# Patient Record
Sex: Male | Born: 1975 | Race: White | Hispanic: No | Marital: Married | Smoking: Former smoker
Health system: Southern US, Community
[De-identification: ages and names within clinical notes are randomized; demographics above are authoritative.]

## PROBLEM LIST (undated history)

## (undated) DIAGNOSIS — L709 Acne, unspecified: Secondary | ICD-10-CM

## (undated) HISTORY — DX: Acne, unspecified: L70.9

---

## 2005-11-25 ENCOUNTER — Emergency Department (HOSPITAL_COMMUNITY): Admission: EM | Admit: 2005-11-25 | Discharge: 2005-11-25 | Payer: Self-pay | Admitting: Family Medicine

## 2007-07-21 ENCOUNTER — Ambulatory Visit: Payer: Self-pay | Admitting: Family Medicine

## 2007-08-01 ENCOUNTER — Ambulatory Visit: Payer: Self-pay | Admitting: Family Medicine

## 2010-05-26 ENCOUNTER — Ambulatory Visit
Admission: RE | Admit: 2010-05-26 | Discharge: 2010-05-26 | Payer: Self-pay | Source: Home / Self Care | Attending: Family Medicine | Admitting: Family Medicine

## 2010-07-31 ENCOUNTER — Encounter (INDEPENDENT_AMBULATORY_CARE_PROVIDER_SITE_OTHER): Payer: BC Managed Care – PPO | Admitting: Family Medicine

## 2010-07-31 DIAGNOSIS — K602 Anal fissure, unspecified: Secondary | ICD-10-CM

## 2010-07-31 DIAGNOSIS — F17200 Nicotine dependence, unspecified, uncomplicated: Secondary | ICD-10-CM

## 2010-07-31 DIAGNOSIS — Z0000 Encounter for general adult medical examination without abnormal findings: Secondary | ICD-10-CM

## 2010-12-08 ENCOUNTER — Encounter: Payer: Self-pay | Admitting: Family Medicine

## 2010-12-09 ENCOUNTER — Other Ambulatory Visit (INDEPENDENT_AMBULATORY_CARE_PROVIDER_SITE_OTHER): Payer: Managed Care, Other (non HMO)

## 2010-12-09 DIAGNOSIS — Z23 Encounter for immunization: Secondary | ICD-10-CM

## 2010-12-09 DIAGNOSIS — Z0000 Encounter for general adult medical examination without abnormal findings: Secondary | ICD-10-CM

## 2011-07-09 ENCOUNTER — Encounter: Payer: Self-pay | Admitting: Family Medicine

## 2011-07-09 ENCOUNTER — Ambulatory Visit (INDEPENDENT_AMBULATORY_CARE_PROVIDER_SITE_OTHER): Payer: 59 | Admitting: Family Medicine

## 2011-07-09 VITALS — BP 130/90 | HR 87 | Temp 98.6°F | Ht 70.0 in | Wt 155.0 lb

## 2011-07-09 DIAGNOSIS — F17200 Nicotine dependence, unspecified, uncomplicated: Secondary | ICD-10-CM | POA: Insufficient documentation

## 2011-07-09 DIAGNOSIS — J209 Acute bronchitis, unspecified: Secondary | ICD-10-CM

## 2011-07-09 MED ORDER — CLARITHROMYCIN 500 MG PO TABS: 500.0000 mg | ORAL_TABLET | Freq: Two times a day (BID) | ORAL | Status: AC

## 2011-07-09 NOTE — Patient Instructions (Signed)
Wait till tomorrow and see how you feel . If you are getting worse go ahead and take the antibiotic

## 2011-07-09 NOTE — Progress Notes (Signed)
  Subjective:    Patient ID: Jacob Ball, male    DOB: November 12, 1975, 36 y.o.   MRN: [id number]  HPI Approximately 10 days ago he woke up with a slight sore throat and nasal congestion followed a couple days later by coughing it became productive. No fever, chills or earache. The cough has gotten worse although today he does take it he is slightly better. He smokes. He also admits to drinking 3 or 4 beers per night.   Review of Systems     Objective:   Physical Exam alert and in no distress. Tympanic membranes and canals are normal. Throat is clear. Tonsils are normal. Neck is supple without adenopathy or thyromegaly. Cardiac exam shows a regular sinus rhythm without murmurs or gallops. Lungs are clear to auscultation.        Assessment & Plan:   1. Bronchitis, acute   2. Current smoker    I will write for Biaxin to recommend he wait until tomorrow before taking this. If he gets worse he'll start the medication. Also encouraged him to quit smoking although he is not quite ready for that. Also discussed the fact he should cut back on his alcohol consumption.

## 2012-02-03 ENCOUNTER — Ambulatory Visit (INDEPENDENT_AMBULATORY_CARE_PROVIDER_SITE_OTHER): Payer: 59 | Admitting: Family Medicine

## 2012-02-03 VITALS — BP 128/80 | HR 97 | Temp 98.7°F | Wt 156.0 lb

## 2012-02-03 DIAGNOSIS — J029 Acute pharyngitis, unspecified: Secondary | ICD-10-CM

## 2012-02-03 LAB — POCT RAPID STREP A (OFFICE): Rapid Strep A Screen: NEGATIVE

## 2012-02-03 NOTE — Progress Notes (Signed)
  Subjective:    Patient ID: Jacob Ball, male    DOB: 12-Nov-1975, 36 y.o.   MRN: [id number]  HPI Last night he noted a slight tickling in his throat. This morning he woke up with worsening throat, nasal congestion and hoarse voice. Fever, chills, earache, chest congestion. He smokes less than half a pack per day.   Review of Systems     Objective:   Physical Exam alert and in no distress. Tympanic membranes and canals are normal. Throat is clear. Tonsils are normal. Neck is supple without adenopathy or thyromegaly. Cardiac exam shows a regular sinus rhythm without murmurs or gallops. Lungs are clear to auscultation. Strep screen is negative       Assessment & Plan:   1. Pharyngitis  Rapid Strep A   recommend supportive care. Call if further trouble.

## 2012-02-03 NOTE — Patient Instructions (Signed)
Just treat your symptoms. Gargle if you need to. Tylenol for the pain. Call if further trouble

## 2013-02-26 ENCOUNTER — Ambulatory Visit (INDEPENDENT_AMBULATORY_CARE_PROVIDER_SITE_OTHER): Payer: 59 | Admitting: Family Medicine

## 2013-02-26 ENCOUNTER — Encounter: Payer: Self-pay | Admitting: Family Medicine

## 2013-02-26 VITALS — BP 120/82 | HR 68 | Wt 162.0 lb

## 2013-02-26 DIAGNOSIS — K648 Other hemorrhoids: Secondary | ICD-10-CM

## 2013-02-26 DIAGNOSIS — K644 Residual hemorrhoidal skin tags: Secondary | ICD-10-CM

## 2013-02-26 DIAGNOSIS — Z23 Encounter for immunization: Secondary | ICD-10-CM

## 2013-02-26 MED ORDER — HYDROCORTISONE ACE-PRAMOXINE 1-1 % RE FOAM: 1.0000 | Freq: Two times a day (BID) | RECTAL | Status: DC

## 2013-02-26 NOTE — Progress Notes (Signed)
  Subjective:    Patient ID: Jacob Ball, male    DOB: November 13, 1975, 37 y.o.   MRN: [id number]  HPI 3 days ago he noted the onset of slight bleeding and rectal pain with swelling. The bleeding has quieted down. He has no previous history of bleeding hemorrhoids but does have a previous history of redundant tissue   Review of Systems     Objective:   Physical Exam Anal exam shows prolapsed internal as well as external hemorrhoids. The internal hemorrhoids were able to be reduced with some relief of his symptoms. No evidence of thrombosis is noted to       Assessment & Plan:  Need for prophylactic vaccination and inoculation against influenza - Plan: Flu Vaccine QUAD 36+ mos IM  Internal prolapsed hemorrhoids - Plan: hydrocortisone-pramoxine (PROCTOFOAM HC) rectal foam  Internal and external bleeding hemorrhoids - Plan: hydrocortisone-pramoxine (PROCTOFOAM HC) rectal foam  Use ProctoFoam twice per day and also use some on the outside. Irrigate the area with warm water for 10 or 15 minutes several times per day. Softer bowel movements with fluids bulk in your diet ,exercise keeps you regular and when he had the urge to go go. If he has no improvement, he will call me

## 2013-02-26 NOTE — Patient Instructions (Addendum)
Use ProctoFoam twice per day and also use some on the outside. Irrigate the area with warm water for 10 or 15 minutes several times per day. Softer bowel movements with fluids bulk in your diet ,exercise keeps you regular and when he had the urge to go go.

## 2013-06-29 ENCOUNTER — Encounter: Payer: Self-pay | Admitting: Medical

## 2013-06-29 ENCOUNTER — Ambulatory Visit (INDEPENDENT_AMBULATORY_CARE_PROVIDER_SITE_OTHER): Payer: 59 | Admitting: Medical

## 2013-06-29 VITALS — BP 140/92 | HR 78 | Temp 98.0°F | Resp 16 | Wt 158.0 lb

## 2013-06-29 DIAGNOSIS — J329 Chronic sinusitis, unspecified: Secondary | ICD-10-CM

## 2013-06-29 MED ORDER — CLARITHROMYCIN 500 MG PO TABS: 500.0000 mg | ORAL_TABLET | Freq: Two times a day (BID) | ORAL | Status: DC

## 2013-06-29 NOTE — Patient Instructions (Signed)

## 2013-06-29 NOTE — Progress Notes (Signed)
   Subjective:   Jacob Ball is a 38 y.o. male presenting on 06/29/2013 with Cough  Hasn't been feeling great the last few weeks, but the last 2 weeks with head pressure, sinus pressure, cough, chest congestion, fatigue.  Seems to get a little better, tjhen worse again.  Has felt feverish at times, some chills, some wheezing.  Has some ear pressure.   Worse symptoms in head.   Denies nausea, vomiting, no SOB, no teeth pain.  Used neti pot one day.  He is a smoker.  +sick contacts.  No other aggravating or relieving factors.  No other complaint.  Review of Systems  ROS as in subjective    Objective:    Filed Vitals:   06/29/13 1154  BP: 140/92  Pulse: 78  Temp: 98 F (36.7 C)  Resp: 16    General appearance: alert, no distress, WD/WN HEENT: normocephalic, sclerae anicteric, TMs flat with serous effusion mild bilat, nares patent, no discharge +erythema, pharynx normal Oral cavity: MMM, no lesions Neck: supple, no lymphadenopathy, no thyromegaly, no masses Heart: RRR, normal S1, S2, no murmurs Lungs: CTA bilaterally, no wheezes, rhonchi, or rales     Assessment: Encounter Diagnosis  Name Primary?  . Sinusitis Yes    Plan: 1. Sinusitis Rest, hydrate well, discussed supportive care, call/return if not improving. - clarithromycin (BIAXIN) 500 MG tablet; Take 1 tablet (500 mg total) by mouth 2 (two) times daily.  Dispense: 20 tablet; Refill: 0   Return if symptoms worsen or fail to improve.

## 2017-04-07 DIAGNOSIS — Z23 Encounter for immunization: Secondary | ICD-10-CM | POA: Diagnosis not present

## 2017-10-05 ENCOUNTER — Ambulatory Visit: Payer: 59 | Admitting: Family Medicine

## 2017-10-05 ENCOUNTER — Encounter: Payer: Self-pay | Admitting: Family Medicine

## 2017-10-05 VITALS — BP 130/90 | HR 62 | Temp 98.6°F | Resp 16 | Ht 70.0 in | Wt 168.4 lb

## 2017-10-05 DIAGNOSIS — L089 Local infection of the skin and subcutaneous tissue, unspecified: Secondary | ICD-10-CM | POA: Diagnosis not present

## 2017-10-05 DIAGNOSIS — W57XXXA Bitten or stung by nonvenomous insect and other nonvenomous arthropods, initial encounter: Secondary | ICD-10-CM

## 2017-10-05 DIAGNOSIS — S70361A Insect bite (nonvenomous), right thigh, initial encounter: Secondary | ICD-10-CM

## 2017-10-05 DIAGNOSIS — L03115 Cellulitis of right lower limb: Secondary | ICD-10-CM

## 2017-10-05 MED ORDER — DOXYCYCLINE HYCLATE 100 MG PO TABS: 100.0000 mg | ORAL_TABLET | Freq: Two times a day (BID) | ORAL | 0 refills | Status: DC

## 2017-10-05 NOTE — Progress Notes (Signed)
   Subjective:    Patient ID: Jacob Ball, male    DOB: 08-14-1975, 42 y.o.   MRN: [id number]  HPI Chief Complaint  Patient presents with  . tick bite    tick bite- still flat, back of right leg. redness. there less than 12 hours. rash, no fever, headaches,   He is here with complaints of multiple tick bites on his lower extremities over the past 2 weeks but one that occurred on Monday on his right interior thigh seems to be infected. States the area of concern was bitten by a larger size tick 2 days ago.  Reports redness, itching and clear drainage since yesterday.  States the ticks were not on his body for longer than 12 hours and were not engorged.   Denies history of MRSA, diabetes or recurrent abscess.   Denies fever, chills, headache, neck pain, rash on soles or palms, chest pian, palpitations, shortness of breath, abdominal pain, N/V/D.   Reviewed allergies, medications, past medical, surgical, family, and social history.    Review of Systems Pertinent positives and negatives in the history of present illness.     Objective:   Physical Exam  Constitutional: He is oriented to person, place, and time. He appears well-developed and well-nourished. He does not have a sickly appearance. No distress.  Eyes: Pupils are equal, round, and reactive to light. Conjunctivae are normal.  Neck: Normal range of motion. Neck supple.  Neurological: He is alert and oriented to person, place, and time. He has normal strength. No cranial nerve deficit or sensory deficit. Gait normal.  Skin: Skin is warm and dry.     Right medial upper leg with a 1.5 cm area of induration surrounding a pinpoint insect bite with clear drainage. Surrounding pruritic area of erythema and warmth.  RLE is neurovascularly intact.    BP 130/90   Pulse 62   Temp 98.6 F (37 C) (Oral)   Resp 16   Ht  (1.778 m)   Wt 168 lb 6.4 oz (76.4 kg)   SpO2 98%   BMI 24.16 kg/m       Assessment &  Plan:  Cellulitis of right lower extremity - Plan: doxycycline (VIBRA-TABS) 100 MG tablet  Tick bite of right thigh with infection, initial encounter - Plan: doxycycline (VIBRA-TABS) 100 MG tablet  No systemic symptoms.  Discussed that the ticks that bit him were not embedded for longer than 12 hours and did not have a blood meal.  He does have acute cellulitis to his right thigh and will cover him with Doxycycline. He may also want to use Benadryl to control itching. Area of redness marked in the office and he will also take a picture of this. Follow up on Friday to ensure he is improving.

## 2017-10-07 ENCOUNTER — Ambulatory Visit: Payer: 59 | Admitting: Family Medicine

## 2017-10-07 ENCOUNTER — Encounter: Payer: Self-pay | Admitting: Family Medicine

## 2017-10-07 VITALS — BP 130/86 | HR 77 | Temp 98.2°F | Ht 70.0 in | Wt 168.2 lb

## 2017-10-07 DIAGNOSIS — Z09 Encounter for follow-up examination after completed treatment for conditions other than malignant neoplasm: Secondary | ICD-10-CM

## 2017-10-07 DIAGNOSIS — W57XXXD Bitten or stung by nonvenomous insect and other nonvenomous arthropods, subsequent encounter: Secondary | ICD-10-CM

## 2017-10-07 NOTE — Progress Notes (Signed)
   Subjective:    Patient ID: Jacob Ball, male    DOB: [birth year], 42 y.o.   MRN: [id number]  HPI Chief Complaint  Patient presents with  . other    follow up from tick bite   He is here to follow up on a tick bite and cellulitis. Started him on Doxycycline and he reports doing much better.   Denies fever, chills, headache, abdominal pain, N/V/D.   No rash on palms or soles.   Reviewed allergies, medications, past medical, surgical, family, and social history.    Review of Systems Pertinent positives and negatives in the history of present illness.     Objective:   Physical Exam BP 130/86 (BP Location: Left Arm, Patient Position: Sitting)   Pulse 77   Temp 98.2 F (36.8 C)   Ht  (1.778 m)   Wt 168 lb 3.2 oz (76.3 kg)   SpO2 97%   BMI 24.13 kg/m   Area of induration on his right medial thigh has diminished significantly. No longer has erythema surrounding. No drainage.      Assessment & Plan:  Follow up  Tick bite, subsequent encounter  He is doing much better. Continue Doxy and follow up if needed.

## 2017-10-21 ENCOUNTER — Ambulatory Visit: Payer: 59 | Admitting: Family Medicine

## 2017-10-21 ENCOUNTER — Encounter: Payer: Self-pay | Admitting: Family Medicine

## 2017-10-21 VITALS — BP 142/88 | HR 86 | Temp 98.3°F | Ht 69.0 in | Wt 168.2 lb

## 2017-10-21 DIAGNOSIS — Z0000 Encounter for general adult medical examination without abnormal findings: Secondary | ICD-10-CM

## 2017-10-21 DIAGNOSIS — F17200 Nicotine dependence, unspecified, uncomplicated: Secondary | ICD-10-CM

## 2017-10-21 LAB — POCT URINALYSIS DIP (PROADVANTAGE DEVICE)
BILIRUBIN UA: NEGATIVE mg/dL
Bilirubin, UA: NEGATIVE
Blood, UA: NEGATIVE
Glucose, UA: NEGATIVE mg/dL
Leukocytes, UA: NEGATIVE
Nitrite, UA: NEGATIVE
PH UA: 6.5 (ref 5.0–8.0)
PROTEIN UA: NEGATIVE mg/dL
SPECIFIC GRAVITY, URINE: 1.01
Urobilinogen, Ur: 3.5

## 2017-10-21 NOTE — Progress Notes (Signed)
   Subjective:    Patient ID: Jacob Ball, male    DOB: [birth year], 42 y.o.   MRN: [id number]  HPI He is here for complete examination.  He has no particular concerns or complaints.  He is now smoking only 3 cigarettes/day.  His work is going well.  His marriage is also going well.  He is on no medications.  He does not exercise regularly.  Family and social history as well as health maintenance and immunizations was reviewed.   Review of Systems     Objective:   Physical Exam BP (!) 142/88 (BP Location: Left Arm, Patient Position: Sitting)   Pulse 86   Temp 98.3 F (36.8 C)   Ht  (1.753 m)   Wt 168 lb 3.2 oz (76.3 kg)   SpO2 97%   BMI 24.84 kg/m   General Appearance:    Alert, cooperative, no distress, appears stated age  Head:    Normocephalic, without obvious abnormality, atraumatic  Eyes:    PERRL, conjunctiva/corneas clear, EOM's intact, fundi    benign  Ears:    Normal TM's and external ear canals  Nose:   Nares normal, mucosa normal, no drainage or sinus   tenderness  Throat:   Lips, mucosa, and tongue normal; teeth and gums normal  Neck:   Supple, no lymphadenopathy;  thyroid:  no   enlargement/tenderness/nodules; no carotid   bruit or JVD     Lungs:     Clear to auscultation bilaterally without wheezes, rales or     ronchi; respirations unlabored      Heart:    Regular rate and rhythm, S1 and S2 normal, no murmur, rub   or gallop     Abdomen:     Soft, non-tender, nondistended, normoactive bowel sounds,    no masses, no hepatosplenomegaly  Genitalia:    Normal male external genitalia without lesions.  Testicles without masses.  No inguinal hernias.  Rectal:    deferred.  Extremities:   No clubbing, cyanosis or edema  Pulses:   2+ and symmetric all extremities  Skin:   Skin color, texture, turgor normal, no rashes or lesions  Lymph nodes:   Cervical, supraclavicular, and axillary nodes normal  Neurologic:   CNII-XII intact, normal strength,  sensation and gait; reflexes 2+ and symmetric throughout          Psych:   Normal mood, affect, hygiene and grooming.          Assessment & Plan:  Smoker  Routine general medical examination at a health care facility - Plan: CBC with Differential/Platelet, Comprehensive metabolic panel, Lipid panel Discussed the fact that his blood pressure is slightly elevated and recommend exercise on a regular basis.  At this point do not feel the need to put him on any medication but will continue to monitor this.  He was comfortable with that.  Also discussed the fact that he can stop smoking Whenever he is ready that these are really more for psychological reasons.  He did understand

## 2017-10-21 NOTE — Patient Instructions (Signed)
20 minutes of something physical daily or 150 minutes a week . 

## 2017-10-22 LAB — COMPREHENSIVE METABOLIC PANEL
ALBUMIN: 4.7 g/dL (ref 3.5–5.5)
ALK PHOS: 55 IU/L (ref 39–117)
ALT: 21 IU/L (ref 0–44)
AST: 25 IU/L (ref 0–40)
Albumin/Globulin Ratio: 2 (ref 1.2–2.2)
BUN/Creatinine Ratio: 10 (ref 9–20)
BUN: 8 mg/dL (ref 6–24)
Bilirubin Total: 0.6 mg/dL (ref 0.0–1.2)
CO2: 22 mmol/L (ref 20–29)
CREATININE: 0.77 mg/dL (ref 0.76–1.27)
Calcium: 9.6 mg/dL (ref 8.7–10.2)
Chloride: 102 mmol/L (ref 96–106)
GFR calc Af Amer: 129 mL/min/{1.73_m2} (ref >59)
GFR calc non Af Amer: 112 mL/min/{1.73_m2} (ref >59)
Globulin, Total: 2.4 g/dL (ref 1.5–4.5)
Glucose: 94 mg/dL (ref 65–99)
Potassium: 4.4 mmol/L (ref 3.5–5.2)
Sodium: 138 mmol/L (ref 134–144)
Total Protein: 7.1 g/dL (ref 6.0–8.5)

## 2017-10-22 LAB — CBC WITH DIFFERENTIAL/PLATELET
Basophils Absolute: 0.1 10*3/uL (ref 0.0–0.2)
Basos: 1 %
EOS (ABSOLUTE): 0.2 10*3/uL (ref 0.0–0.4)
Eos: 3 %
HEMOGLOBIN: 15.9 g/dL (ref 13.0–17.7)
Hematocrit: 45.4 % (ref 37.5–51.0)
IMMATURE GRANULOCYTES: 0 %
Immature Grans (Abs): 0 10*3/uL (ref 0.0–0.1)
LYMPHS ABS: 1.2 10*3/uL (ref 0.7–3.1)
Lymphs: 24 %
MCH: 31.6 pg (ref 26.6–33.0)
MCHC: 35 g/dL (ref 31.5–35.7)
MCV: 90 fL (ref 79–97)
MONOCYTES: 10 %
Monocytes Absolute: 0.5 10*3/uL (ref 0.1–0.9)
NEUTROS PCT: 62 %
Neutrophils Absolute: 3.1 10*3/uL (ref 1.4–7.0)
Platelets: 247 10*3/uL (ref 150–450)
RBC: 5.03 x10E6/uL (ref 4.14–5.80)
RDW: 13.6 % (ref 12.3–15.4)
WBC: 5.1 10*3/uL (ref 3.4–10.8)

## 2017-10-22 LAB — LIPID PANEL
CHOLESTEROL TOTAL: 193 mg/dL (ref 100–199)
Chol/HDL Ratio: 2.6 ratio (ref 0.0–5.0)
HDL: 75 mg/dL (ref >39)
LDL CALC: 106 mg/dL — AB (ref 0–99)
TRIGLYCERIDES: 60 mg/dL (ref 0–149)
VLDL CHOLESTEROL CAL: 12 mg/dL (ref 5–40)

## 2017-11-07 ENCOUNTER — Other Ambulatory Visit: Payer: 59

## 2017-11-07 ENCOUNTER — Other Ambulatory Visit: Payer: Self-pay

## 2017-11-07 ENCOUNTER — Telehealth: Payer: Self-pay

## 2017-11-07 DIAGNOSIS — Z1211 Encounter for screening for malignant neoplasm of colon: Secondary | ICD-10-CM

## 2017-11-07 LAB — HEMOCCULT GUIAC POC 1CARD (OFFICE)
Card #3 Fecal Occult Blood, POC: NEGATIVE
FECAL OCCULT BLD: NEGATIVE
Fecal Occult Blood, POC: NEGATIVE

## 2017-11-07 NOTE — Telephone Encounter (Signed)
Called pt to fid out when he provided the stool samples. No answer lvm. KH

## 2017-11-10 ENCOUNTER — Encounter: Payer: Self-pay | Admitting: Family Medicine

## 2017-11-10 ENCOUNTER — Ambulatory Visit: Payer: 59 | Admitting: Family Medicine

## 2017-11-10 VITALS — BP 138/88 | HR 74 | Temp 98.1°F | Ht 69.0 in | Wt 169.8 lb

## 2017-11-10 DIAGNOSIS — R201 Hypoesthesia of skin: Secondary | ICD-10-CM | POA: Diagnosis not present

## 2017-11-10 NOTE — Progress Notes (Signed)
   Subjective:    Patient ID: Jacob Ball, male    DOB: 01/03/1976, 42 y.o.   MRN: [id number]  HPI He apparently woke up Monday and had a tingling sensation in his left arm down to the fourth and fifth fingers.  That sensation has essentially gone away.  He also states that he had a rash but again today it is not present.   Review of Systems     Objective:   Physical Exam Alert and in no distress.  Exam of the arm shows no obvious lesions.  Normal motor, sensory and DTRs.       Assessment & Plan:  Hypesthesia I explained that the area he is talking about could easily be the ulnar nerve and he possibly could have had a slight compression on that.  He was comfortable with that.  Discussed keeping his elbow bent at 90 degrees

## 2018-05-23 ENCOUNTER — Ambulatory Visit
Admission: RE | Admit: 2018-05-23 | Discharge: 2018-05-23 | Disposition: A | Discharge status: Home / Self Care | Payer: 59 | Source: Ambulatory Visit | Attending: Medical | Admitting: Medical

## 2018-05-23 ENCOUNTER — Ambulatory Visit: Payer: 59 | Admitting: Medical

## 2018-05-23 ENCOUNTER — Encounter: Payer: Self-pay | Admitting: Medical

## 2018-05-23 VITALS — BP 120/78 | HR 82 | Temp 98.3°F | Wt 175.4 lb

## 2018-05-23 DIAGNOSIS — R05 Cough: Secondary | ICD-10-CM

## 2018-05-23 DIAGNOSIS — F17200 Nicotine dependence, unspecified, uncomplicated: Secondary | ICD-10-CM

## 2018-05-23 DIAGNOSIS — J3489 Other specified disorders of nose and nasal sinuses: Secondary | ICD-10-CM | POA: Diagnosis not present

## 2018-05-23 DIAGNOSIS — R0989 Other specified symptoms and signs involving the circulatory and respiratory systems: Secondary | ICD-10-CM

## 2018-05-23 DIAGNOSIS — R059 Cough, unspecified: Secondary | ICD-10-CM

## 2018-05-23 MED ORDER — AZITHROMYCIN 250 MG PO TABS: ORAL_TABLET | ORAL | 0 refills | Status: DC

## 2018-05-23 NOTE — Progress Notes (Signed)
Subjective: Chief Complaint  Patient presents with  . other    cough and congestion. for about three weeks. more sinus congestion   Symptoms include 3 week hx/o cough, congestion, sinus pressure, will get some improvement but then worse again.   No fever, some sweats.   Cough has been non productive.  No NVD.   Sometimes a little SOB.  Has had some sore throat, has had some blood tinged phlegm 2 weeks ago but this improved.   Currently symptoms are mainly lingering.  Using nothing for symptoms.   Is using Vape.  No other aggravating or relieving factors.  No other complaint.    Past Medical History:  Diagnosis Date  . Acne     Current Outpatient Medications on File Prior to Visit  Medication Sig Dispense Refill  . doxycycline (VIBRA-TABS) 100 MG tablet Take 1 tablet (100 mg total) by mouth 2 (two) times daily. (Patient not taking: Reported on 11/10/2017) 20 tablet 0   No current facility-administered medications on file prior to visit.     ROS as in subjective   Objective: BP 120/78 (BP Location: Left Arm, Patient Position: Sitting)   Pulse 82   Temp 98.3 F (36.8 C)   Wt 175 lb 6.4 oz (79.6 kg)   SpO2 98%   BMI 25.90 kg/m   General appearance: Alert, well developed, well nourished, no distress                             Skin: warm, no rash                           Head: mild sinus tenderness,                            Eyes: conjunctiva pink, corneas clear                            Ears: flat left tympanic membrane, flat right tympanic membrane, external ear canals normal                          Nose: septum midline, turbinates swollen, with erythema and clear discharge             Mouth/throat: MMM, tongue normal,no pharyngeal erythema                           Neck: supple, no adenopathy, no thyromegaly, non tender                         Lungs: clear, no wheezes, no rales, no rhonchi         Assessment  Encounter Diagnoses  Name Primary?  . Cough Yes  . Sinus  pressure   . Phlegm in throat   . Smoker       Plan: Discussed diagnosis and treatment of respiratory tract infection.  Begin Zpak, go for chest xray, rest, hydrate well, can use Mucinex DM.       Discussed usual time frame to see improvement. Discussed possible complications or symptoms that would prompt call back or recheck within the next few days.      Patient was advised to call or return if worse or not  improving in the next few days.    Patient voiced understanding of diagnosis, recommendations, and treatment plan  Jacob Ball was seen today for other.  Diagnoses and all orders for this visit:  Cough -     DG Chest 2 View; Future  Sinus pressure  Phlegm in throat -     DG Chest 2 View; Future  Smoker -     DG Chest 2 View; Future  Other orders -     azithromycin (ZITHROMAX) 250 MG tablet; 2 tablets day 1, then 1 tablet days 2-4

## 2018-05-23 NOTE — Patient Instructions (Signed)
Recommendations:  Rest  hydrate well  Consider Mucinex DM  Begin Zpak antibiotic  Go for chest xray  If worse or not improving, call back

## 2019-12-24 IMAGING — DX DG CHEST 2V
2 series · 2 of 2 positions shown · non-contrast
Comparison: None.

CLINICAL DATA: Cough

EXAM:
CHEST - 2 VIEW

[dg chest 2 view (1 of 2)]
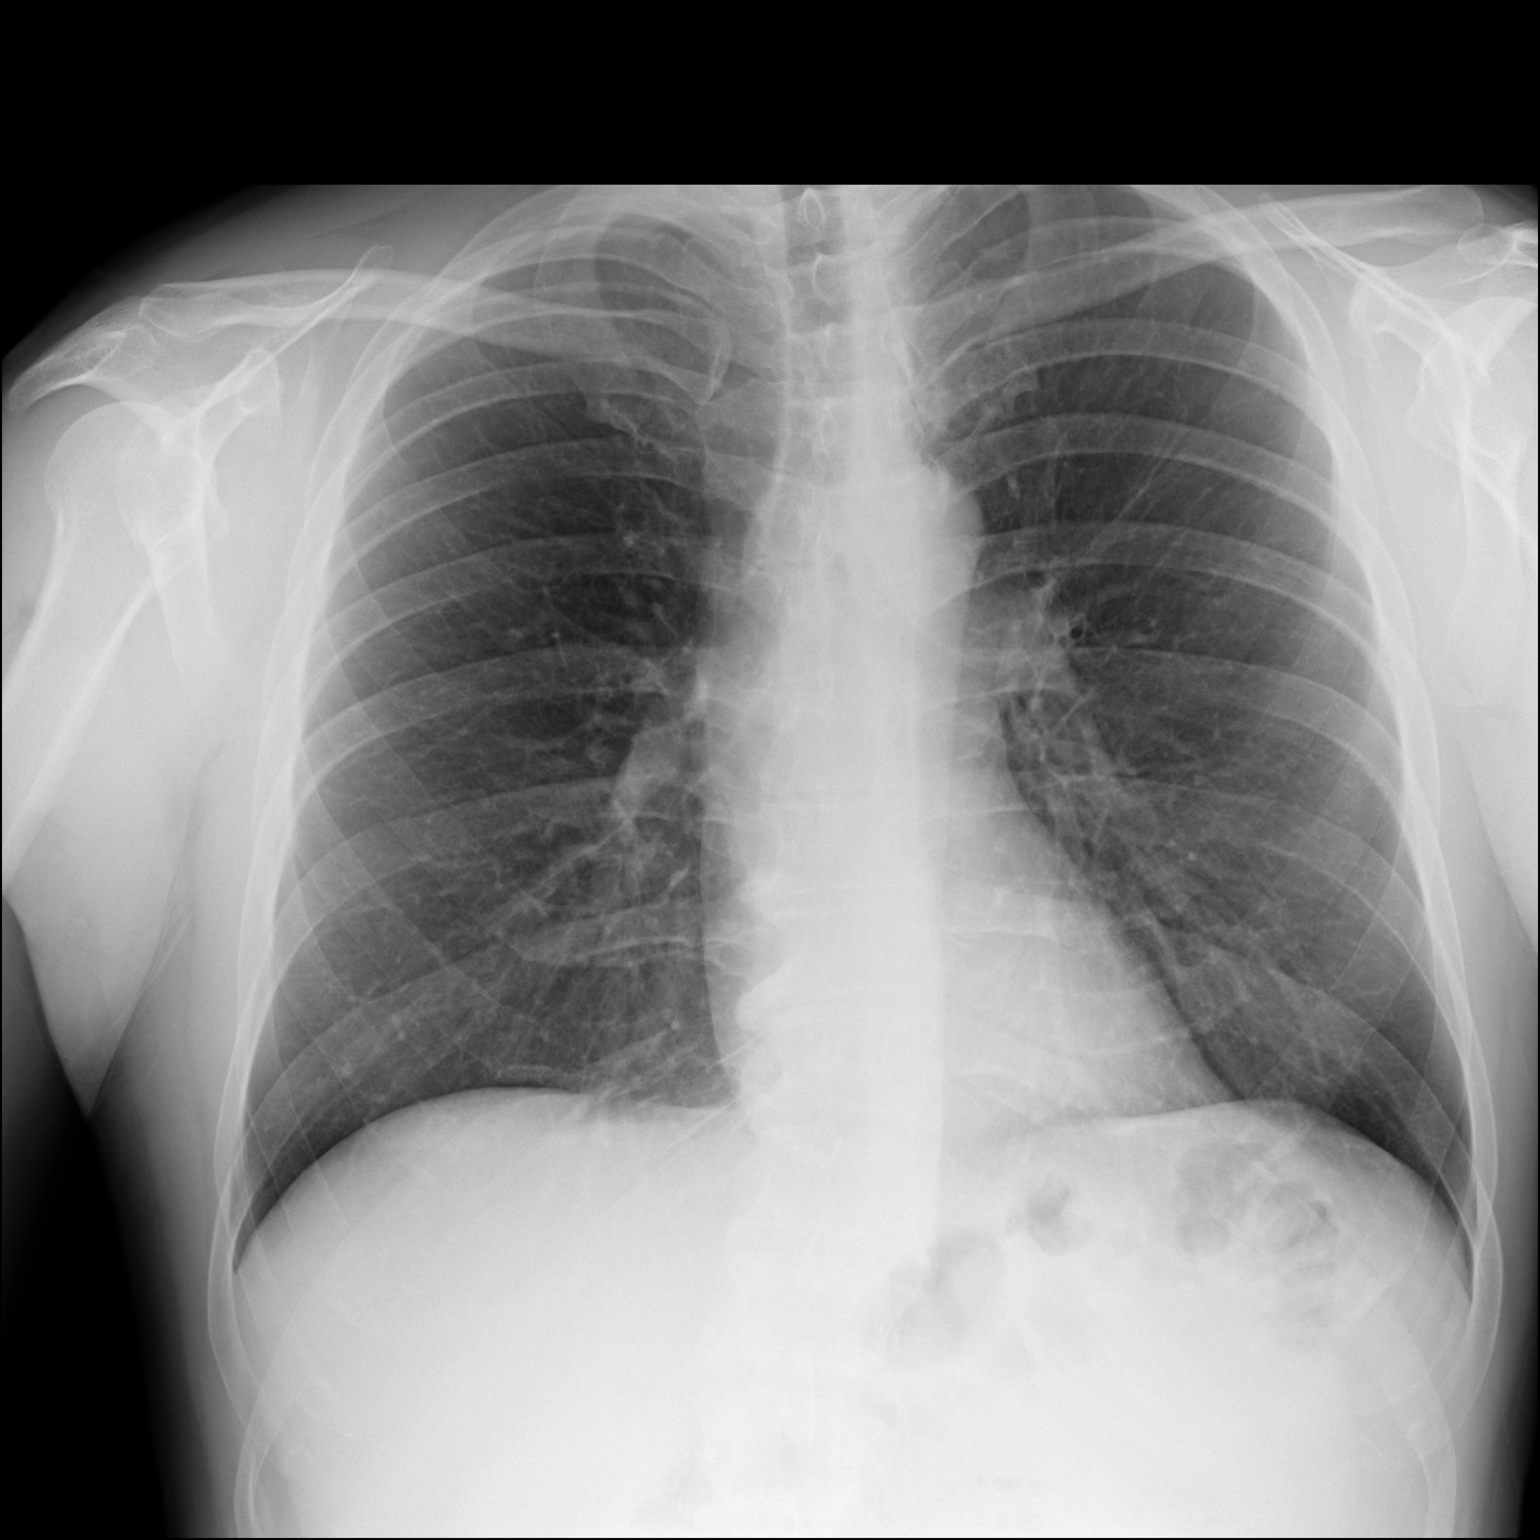

[dg chest 2 view (2 of 2)]
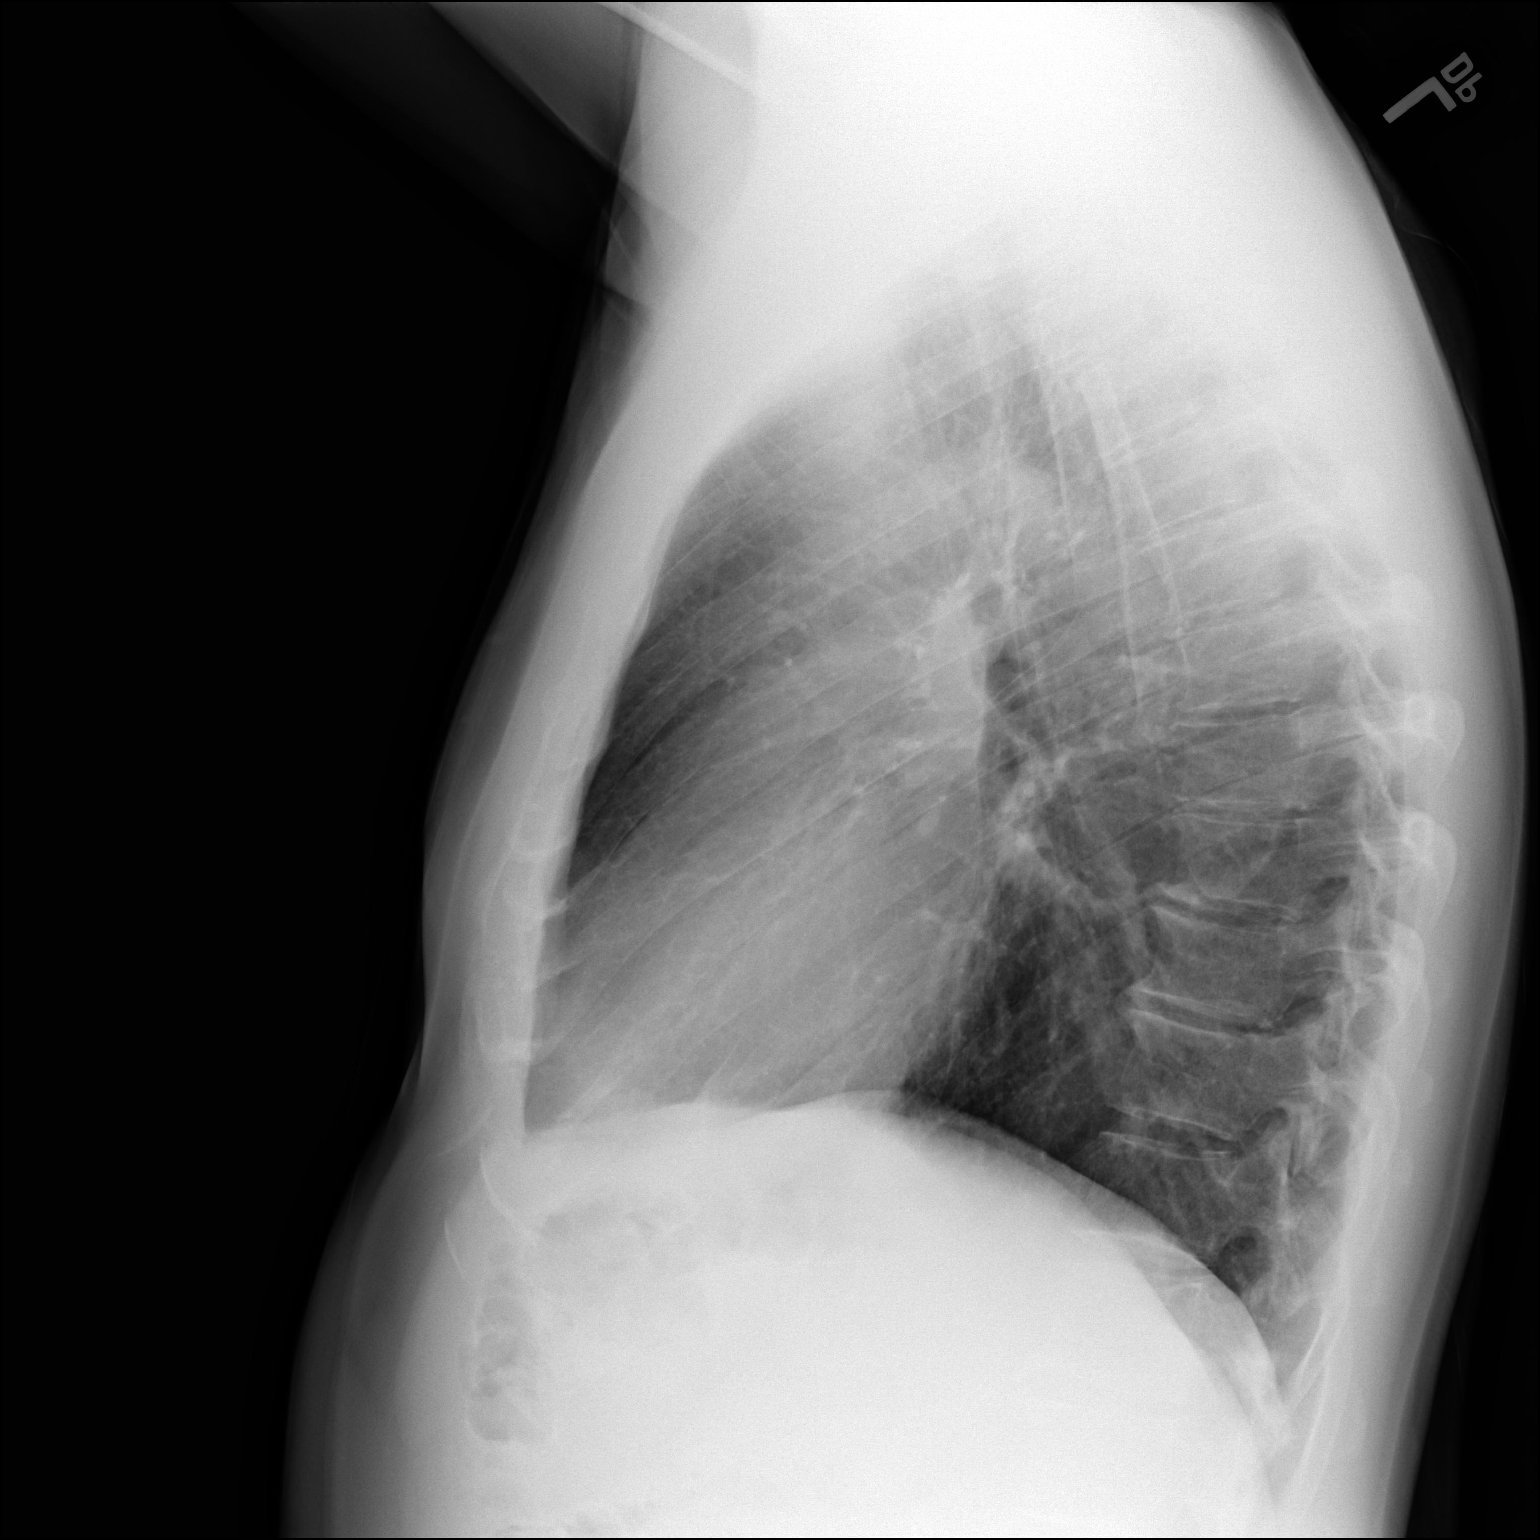

[2 of 2 positions shown; findings below may reference images not displayed]

FINDINGS: Lungs are clear. The heart size and pulmonary vascularity are
normal. No adenopathy. There is degenerative change in the thoracic
spine.
IMPRESSION: No edema or consolidation.

## 2022-01-27 ENCOUNTER — Encounter: Payer: Self-pay | Admitting: Internal Medicine

## 2022-03-02 ENCOUNTER — Encounter: Payer: Self-pay | Admitting: Internal Medicine

## 2022-03-15 ENCOUNTER — Encounter: Payer: Self-pay | Admitting: Internal Medicine

## 2022-11-03 ENCOUNTER — Telehealth: Payer: Managed Care, Other (non HMO) | Admitting: Physician Assistant

## 2022-11-03 DIAGNOSIS — M545 Low back pain, unspecified: Secondary | ICD-10-CM

## 2022-11-04 MED ORDER — CYCLOBENZAPRINE HCL 10 MG PO TABS: 10.0000 mg | ORAL_TABLET | Freq: Three times a day (TID) | ORAL | 0 refills | Status: DC | PRN

## 2022-11-04 MED ORDER — NAPROXEN 500 MG PO TABS: 500.0000 mg | ORAL_TABLET | Freq: Two times a day (BID) | ORAL | 0 refills | Status: DC

## 2022-11-04 NOTE — Progress Notes (Signed)

## 2022-11-04 NOTE — Progress Notes (Signed)
I have spent 5 minutes in review of e-visit questionnaire, review and updating patient chart, medical decision making and response to patient.   Baruc Tugwell Cody Abdimalik Mayorquin, PA-C    

## 2022-11-06 MED ORDER — PREDNISONE 20 MG PO TABS: 40.0000 mg | ORAL_TABLET | Freq: Every day | ORAL | 0 refills | Status: AC

## 2022-11-06 NOTE — Addendum Note (Signed)
Addended by: Christeen Douglas Z on: 11/06/2022 02:42 PM   Modules accepted: Orders

## 2022-11-08 ENCOUNTER — Ambulatory Visit (INDEPENDENT_AMBULATORY_CARE_PROVIDER_SITE_OTHER): Payer: Managed Care, Other (non HMO) | Admitting: Family Medicine

## 2022-11-08 ENCOUNTER — Encounter: Payer: Self-pay | Admitting: Family Medicine

## 2022-11-08 VITALS — BP 134/84 | HR 84 | Ht 69.0 in | Wt 183.2 lb

## 2022-11-08 DIAGNOSIS — M5416 Radiculopathy, lumbar region: Secondary | ICD-10-CM | POA: Diagnosis not present

## 2022-11-08 NOTE — Progress Notes (Signed)
Chief Complaint  Patient presents with   Back Pain    Right sided LBP that started last Thursday. Sat down wrong Thursday am and it has been a struggle ever since. Did a virtual visit Thursday morning before this happened.    This patient hasn't been seen in our office since 2019. He recalls that in the Spring of 2023 he jarred his back (missed the last step), got better pretty quickly (within a week or so). He hasn't had any problems since then until he "tweaked" his back a couple of weeks ago. Can't recall anything that triggered it, was painful in the morning.  He had e-visit on 6/13. Complaint at that time was low back/buttock pain on R.  Pain radiated down the R glute to the hamstring to the top of the calf.  Worse with walking, lifting legs. Stretching and lunges made it worse. Through e-visit, he was prescribed flexeril and naproxen. Thurs 6/13 he was able to get his prescriptions. Spasms got worse later that day.  He sent additional MyChart message on 6/15--pain worse, unable to sit at desk chair, walking very slow/tedious/painful. Rx for prednisone was sent in that day, 20mg , to take 40mg  x 5 d He took just one 20 mg tablet yesterday.  Currently--pain goes further down the calf than it did over the weekend. Sometimes the 3, 4, and 5th toes go numb. Denies weakness Pain is at the lower back/hip/glute and down the hamstrings, and the lateral calf of the RLE   PMH, PSH, SH reviewed and updated in chart  Took 2 naproxen on Fri and Sat, none yesterday Taking flexeril TID. Denies significant sedation.  Reports drinking 3-4 beers daily  Outpatient Encounter Medications as of 11/08/2022  Medication Sig Note   cyclobenzaprine (FLEXERIL) 10 MG tablet Take 1 tablet (10 mg total) by mouth 3 (three) times daily as needed for muscle spasms. 11/08/2022: Last dose this am 6am   predniSONE (DELTASONE) 20 MG tablet Take 2 tablets (40 mg total) by mouth daily with breakfast for 5 days. 11/08/2022:  Did not take today   naproxen (NAPROSYN) 500 MG tablet Take 1 tablet (500 mg total) by mouth 2 (two) times daily with a meal. (Patient not taking: Reported on 11/08/2022) 11/08/2022: Stopped taking naproxen Saturday   [DISCONTINUED] azithromycin (ZITHROMAX) 250 MG tablet 2 tablets day 1, then 1 tablet days 2-4 (Patient not taking: Reported on 11/08/2022)    [DISCONTINUED] doxycycline (VIBRA-TABS) 100 MG tablet Take 1 tablet (100 mg total) by mouth 2 (two) times daily. (Patient not taking: Reported on 11/10/2017)    No facility-administered encounter medications on file as of 11/08/2022.   Allergies  Allergen Reactions   Penicillins     ROS: No bowel or bladder problems. No f/c ,URI symptoms, CP, palpitations, SOB, no GI or GU complaints. No bleeding, bruising, rash   PHYSICAL EXAM:  BP 134/84   Pulse 84   Ht 5\' 9"  (1.753 m)   Wt 183 lb 3.2 oz (83.1 kg)   BMI 27.05 kg/m   Pleasant male, in severe discomfort with walking, sitting, and certain movements.  Seems relatively comfortable when standing still HEENT: conjunctiva and sclera are clear, EOMI. Back: Spine nontender, no CVA tenderness Firm/tender at R QL muscles Tender at  R sciatic notch, Slight tenderness at R SI. Tender at R gluteal/pyriformis muscles. Unable to cross R ankle to L knee due to pain. Normal on the left. He had acute pain at R buttock with lifting LEFT leg Some  radiation down lateral thigh/knee with R SLR Normal strength. Normal sensation tor light touch and temperature DTR's are 2+ and symmetric Extremities: no edema Psych: normal mood, affect, hygiene and grooming   ASSESSMENT/PLAN:  Lumbar back pain with radiculopathy affecting right lower extremity - take course of prednisone as rx'd. Will try and get him in for PT today.  Red flags reviewed, return if worsening  No red flags today, these were reviewed. Refer for PT and take prednisone course as prescribed. Cont muscle relaxants. Counseled re: alcohol  and meds. Encouraged to cut back. Long past due for CPE--may need to f/u on back in 1-2 weeks, but also schedule CPE with Dr. Susann Givens.  I spent 38 minutes dedicated to the care of this patient, including pre-visit review of records, face to face time, post-visit ordering of testing and documentation.  Take the prednisone as prescribed (40 mg (2 tablets) for 5 days). Take with food. Do not take the naproxen. You may continue the muscle relaxant. Avoid taking with alcohol--try and cut back on alcohol in general  Schedule a follow-up appointment and a physical with Dr. Susann Givens. We are referring you to PT.

## 2022-11-08 NOTE — Patient Instructions (Signed)
Take the prednisone as prescribed (40 mg (2 tablets) for 5 days). Take with food. Do not take the naproxen. You may continue the muscle relaxant. Avoid taking with alcohol--try and cut back on alcohol in general  Schedule a follow-up appointment and a physical with Dr. Susann Givens. We are referring you to PT.

## 2022-11-11 ENCOUNTER — Encounter: Payer: Self-pay | Admitting: Family Medicine

## 2022-11-11 ENCOUNTER — Telehealth: Payer: Self-pay | Admitting: Family Medicine

## 2022-11-11 NOTE — Telephone Encounter (Signed)
Yes, he may take the naproxen twice daily with food. I hope that he is getting some benefit from the PT.

## 2022-11-11 NOTE — Telephone Encounter (Signed)
Pt seen you Monday for back pain. He says he finished the prednisone and is still hurting. He took the last two yesterday morning around 8 am but he does have some Naproxen left and wondered if it would be ok to take that now that he is out of prednisone?

## 2022-12-13 ENCOUNTER — Encounter: Payer: Self-pay | Admitting: Family Medicine

## 2022-12-13 ENCOUNTER — Ambulatory Visit (INDEPENDENT_AMBULATORY_CARE_PROVIDER_SITE_OTHER): Payer: Managed Care, Other (non HMO) | Admitting: Family Medicine

## 2022-12-13 VITALS — BP 148/108 | HR 101 | Temp 98.3°F | Resp 16 | Ht 69.5 in | Wt 184.8 lb

## 2022-12-13 DIAGNOSIS — Z87891 Personal history of nicotine dependence: Secondary | ICD-10-CM | POA: Diagnosis not present

## 2022-12-13 DIAGNOSIS — R030 Elevated blood-pressure reading, without diagnosis of hypertension: Secondary | ICD-10-CM

## 2022-12-13 DIAGNOSIS — Z1211 Encounter for screening for malignant neoplasm of colon: Secondary | ICD-10-CM

## 2022-12-13 DIAGNOSIS — Z23 Encounter for immunization: Secondary | ICD-10-CM

## 2022-12-13 DIAGNOSIS — Z0000 Encounter for general adult medical examination without abnormal findings: Secondary | ICD-10-CM | POA: Diagnosis not present

## 2022-12-13 DIAGNOSIS — Z1159 Encounter for screening for other viral diseases: Secondary | ICD-10-CM

## 2022-12-13 DIAGNOSIS — F17200 Nicotine dependence, unspecified, uncomplicated: Secondary | ICD-10-CM

## 2022-12-13 NOTE — Patient Instructions (Addendum)
20 minutes of something physical daily or 150 minutes a week of something physical.  Cut down on your beer consumption to 1 or 2/day Also check out the DASH diet

## 2022-12-13 NOTE — Progress Notes (Signed)
Complete physical exam  Patient: Jacob Ball   DOB: 05-28-1975   47 y.o. Male  MRN: [id number]  Subjective:    Chief Complaint  Patient presents with   Annual Exam    Fasting. No additional concerns.     Jacob Ball is a 47 y.o. male who presents today for a complete physical exam.  He reports consuming a general diet. Home exercise routine includes walking and weight lifting. He generally feels fairly well. He reports sleeping fairly well.  He drinks 3 or 4 beers per night.  He did quit smoking.  I congratulated him on that.  Otherwise he has no concerns or complaints.  Family and social history as well as health maintenance and immunizations was reviewed  Most recent fall risk assessment:    12/13/2022    9:29 AM  Fall Risk   Falls in the past year? 0  Number falls in past yr: 0  Injury with Fall? 0  Risk for fall due to : No Fall Risks  Follow up Falls evaluation completed     Most recent depression screenings:    12/13/2022    9:29 AM  PHQ 2/9 Scores  PHQ - 2 Score 0    Vision:Not within last year  and Dental: Receives regular dental care    Immunization History  Administered Date(s) Administered   DTaP 06/21/1975, 09/30/1975, 12/02/1975, 11/25/1976, 07/05/1980   IPV 07/08/1975, 09/30/1975, 12/02/1975, 11/25/1976   Influenza Whole 04/27/2002   Influenza,inj,Quad PF,6+ Mos 02/26/2013   Influenza-Unspecified 04/06/2017, 02/21/2018   MMR 07/22/1976, 10/13/1993   Tdap 12/09/2010, 12/13/2022    Health Maintenance  Topic Date Due   HIV Screening  Never done   Hepatitis C Screening  Never done   Colonoscopy  Never done   COVID-19 Vaccine (1 - 2023-24 season) Never done   INFLUENZA VACCINE  12/23/2022   DTaP/Tdap/Td (8 - Td or Tdap) 12/12/2032   HPV VACCINES  Aged Out    Patient Care Team: Ronnald Nian, MD as PCP - General (Family Medicine)   Outpatient Medications Prior to Visit  Medication Sig   [DISCONTINUED] cyclobenzaprine  (FLEXERIL) 10 MG tablet Take 1 tablet (10 mg total) by mouth 3 (three) times daily as needed for muscle spasms.   [DISCONTINUED] naproxen (NAPROSYN) 500 MG tablet Take 1 tablet (500 mg total) by mouth 2 (two) times daily with a meal. (Patient not taking: Reported on 11/08/2022)   No facility-administered medications prior to visit.    Review of Systems  All other systems reviewed and are negative.   Family and social history as well as health maintenance and immunizations was reviewed.     Objective:    BP (!) 148/108 (BP Location: Right Arm, Cuff Size: Large)   Pulse (!) 101   Temp 98.3 F (36.8 C) (Oral)   Resp 16   Ht 5' 9.5" (1.765 m)   Wt 184 lb 12.8 oz (83.8 kg)   SpO2 97% Comment: room air  BMI 26.90 kg/m    Physical Exam  Alert and in no distress. Tympanic membranes and canals are normal. Pharyngeal area is normal. Neck is supple without adenopathy or thyromegaly. Cardiac exam shows a regular sinus rhythm without murmurs or gallops. Lungs are clear to auscultation.      Assessment & Plan:    Routine general medical examination at a health care facility - Plan: CBC with Differential/Platelet, Comprehensive metabolic panel, Lipid panel  Former smoker  Need for Tdap vaccination -  Plan: Tdap vaccine greater than or equal to 7yo IM  Need for hepatitis C screening test - Plan: Hepatitis C antibody  Screening for colon cancer - Plan: Cologuard  Elevated blood pressure reading  20 minutes of something physical daily or 150 minutes a week of something physical.  Cut down on your beer consumption to 1 or 2/day To return here in 1 month for blood pressure recheck.   Sharlot Gowda, MD

## 2022-12-14 LAB — CBC WITH DIFFERENTIAL/PLATELET
Basophils Absolute: 0.1 10*3/uL (ref 0.0–0.2)
Basos: 2 %
EOS (ABSOLUTE): 0.1 10*3/uL (ref 0.0–0.4)
Eos: 2 %
Hematocrit: 47.3 % (ref 37.5–51.0)
Hemoglobin: 16 g/dL (ref 13.0–17.7)
Immature Grans (Abs): 0 10*3/uL (ref 0.0–0.1)
Immature Granulocytes: 0 %
Lymphocytes Absolute: 1.1 10*3/uL (ref 0.7–3.1)
Lymphs: 24 %
MCH: 30.8 pg (ref 26.6–33.0)
MCHC: 33.8 g/dL (ref 31.5–35.7)
MCV: 91 fL (ref 79–97)
Monocytes Absolute: 0.4 10*3/uL (ref 0.1–0.9)
Monocytes: 10 %
Neutrophils Absolute: 2.8 10*3/uL (ref 1.4–7.0)
Neutrophils: 62 %
Platelets: 226 10*3/uL (ref 150–450)
RBC: 5.19 x10E6/uL (ref 4.14–5.80)
RDW: 12.1 % (ref 11.6–15.4)
WBC: 4.5 10*3/uL (ref 3.4–10.8)

## 2022-12-14 LAB — COMPREHENSIVE METABOLIC PANEL
ALT: 37 IU/L (ref 0–44)
AST: 24 IU/L (ref 0–40)
Albumin: 4.7 g/dL (ref 4.1–5.1)
Alkaline Phosphatase: 67 IU/L (ref 44–121)
BUN/Creatinine Ratio: 9 (ref 9–20)
BUN: 7 mg/dL (ref 6–24)
Bilirubin Total: 0.5 mg/dL (ref 0.0–1.2)
CO2: 22 mmol/L (ref 20–29)
Calcium: 9.6 mg/dL (ref 8.7–10.2)
Chloride: 101 mmol/L (ref 96–106)
Creatinine, Ser: 0.8 mg/dL (ref 0.76–1.27)
Globulin, Total: 2.2 g/dL (ref 1.5–4.5)
Glucose: 128 mg/dL — ABNORMAL HIGH (ref 70–99)
Potassium: 4.3 mmol/L (ref 3.5–5.2)
Sodium: 140 mmol/L (ref 134–144)
Total Protein: 6.9 g/dL (ref 6.0–8.5)
eGFR: 110 mL/min/{1.73_m2} (ref >59)

## 2022-12-14 LAB — LIPID PANEL
Chol/HDL Ratio: 3.6 ratio (ref 0.0–5.0)
Cholesterol, Total: 241 mg/dL — ABNORMAL HIGH (ref 100–199)
HDL: 67 mg/dL (ref >39)
LDL Chol Calc (NIH): 156 mg/dL — ABNORMAL HIGH (ref 0–99)
Triglycerides: 101 mg/dL (ref 0–149)
VLDL Cholesterol Cal: 18 mg/dL (ref 5–40)

## 2022-12-14 LAB — HEPATITIS C ANTIBODY: Hep C Virus Ab: NONREACTIVE

## 2023-01-13 ENCOUNTER — Encounter: Payer: Self-pay | Admitting: Family Medicine

## 2023-01-13 ENCOUNTER — Ambulatory Visit: Payer: Managed Care, Other (non HMO) | Admitting: Family Medicine

## 2023-01-13 VITALS — BP 148/112 | HR 98 | Temp 98.1°F | Resp 14 | Wt 188.4 lb

## 2023-01-13 DIAGNOSIS — I10 Essential (primary) hypertension: Secondary | ICD-10-CM | POA: Diagnosis not present

## 2023-01-13 MED ORDER — LOSARTAN POTASSIUM-HCTZ 50-12.5 MG PO TABS: 1.0000 | ORAL_TABLET | Freq: Every day | ORAL | 3 refills | Status: DC

## 2023-01-13 NOTE — Progress Notes (Signed)
   Subjective:    Patient ID: Jacob Ball, male    DOB: 1975-10-14, 47 y.o.   MRN: [id number]  HPI He is here for recheck on his blood pressure.   Review of Systems     Objective:    Physical Exam Alert and in no distress.  Blood pressure is recorded.  EKG read by me shows a rate of 77 with other parameters being normal.       Assessment & Plan:  Primary hypertension - Plan: losartan-hydrochlorothiazide (HYZAAR) 50-12.5 MG tablet, EKG 12-Lead Recommend that he buy blood pressure cuff and come back in 1 month for a recheck.  Explained the need to get readings at home rather than in the office.  He expressed understanding of this.  Also encouraged him to remain physically active.

## 2023-02-10 ENCOUNTER — Ambulatory Visit: Payer: Managed Care, Other (non HMO) | Admitting: Family Medicine

## 2023-03-10 ENCOUNTER — Encounter: Payer: Self-pay | Admitting: Family Medicine

## 2023-03-10 ENCOUNTER — Ambulatory Visit (INDEPENDENT_AMBULATORY_CARE_PROVIDER_SITE_OTHER): Payer: Managed Care, Other (non HMO) | Admitting: Family Medicine

## 2023-03-10 VITALS — BP 148/88 | HR 118 | Temp 98.2°F | Wt 187.2 lb

## 2023-03-10 DIAGNOSIS — I10 Essential (primary) hypertension: Secondary | ICD-10-CM | POA: Diagnosis not present

## 2023-03-10 NOTE — Progress Notes (Signed)
   Subjective:    Patient ID: Jacob Ball, male    DOB: 1976-04-07, 47 y.o.   MRN: [id number]  HPI He is here for recheck.  He did buy blood pressure cuff and did bring it in with this.  He has been on his BP meds for the last 3 weeks and definitely sees a change in it.   Review of Systems     Objective:    Physical Exam Alert and in no distress.  Blood pressure is recorded.       Assessment & Plan:   Primary hypertension Discussed proper blood pressure taking technique including resting sitting position arm at heart level.  His blood pressure cuff systolic seems to be fairly active but the diastolic goes higher than in the office.  He will make the appropriate adjustments to keep his pressure 130/80 or lower.  Recheck here in 6 months.

## 2023-05-31 LAB — HGB A1C W/O EAG: Hgb A1c MFr Bld: 5.3 % (ref 4.8–5.6)

## 2023-05-31 LAB — SPECIMEN STATUS REPORT

## 2023-07-19 ENCOUNTER — Encounter: Payer: Self-pay | Admitting: Internal Medicine

## 2023-09-13 ENCOUNTER — Ambulatory Visit: Payer: Managed Care, Other (non HMO) | Admitting: Family Medicine

## 2023-09-13 VITALS — BP 120/80 | HR 105 | Wt 188.0 lb

## 2023-09-13 DIAGNOSIS — I10 Essential (primary) hypertension: Secondary | ICD-10-CM | POA: Diagnosis not present

## 2023-09-13 NOTE — Progress Notes (Signed)
   Subjective:    Patient ID: Jacob Ball, male    DOB: December 17, 1975, 48 y.o.   MRN: [id number]  HPI He is here for a recheck on his blood pressure.  He initially said hospital was really an office visit.  He is taking Hyzaar and having no difficulty with that medication.  He is also been checking his blood pressure at home and apparently his machine is fairly accurate.   Review of Systems     Objective:    Physical Exam Alert and in no distress.  Blood pressure is recorded.       Assessment & Plan:  Primary hypertension Continue on present medication regimen.  Recommend he check his blood pressure no more than once per.  Discussed treatment of blood pressure in regard to preventing stroke, heart failure and kidney failure.

## 2023-09-27 ENCOUNTER — Encounter: Payer: Self-pay | Admitting: Family Medicine

## 2023-09-27 ENCOUNTER — Ambulatory Visit (INDEPENDENT_AMBULATORY_CARE_PROVIDER_SITE_OTHER): Admitting: Family Medicine

## 2023-09-27 VITALS — BP 122/70 | HR 105 | Wt 185.4 lb

## 2023-09-27 DIAGNOSIS — L570 Actinic keratosis: Secondary | ICD-10-CM | POA: Diagnosis not present

## 2023-09-27 MED ORDER — FLUOROURACIL 5 % EX CREA: TOPICAL_CREAM | Freq: Two times a day (BID) | CUTANEOUS | 0 refills | Status: DC

## 2023-09-27 NOTE — Progress Notes (Signed)
   Subjective:    Patient ID: Jacob Ball, male    DOB: [birth year], 48 y.o.   MRN: [id number]  HPI He is here for evaluation of several lesions present on the right zygomatic arch area.   Review of Systems     Objective:    Physical Exam He does have a wart-like appearing lesion of approximately 0.5 cm in size and another area near that that does show some slightly waxy appearance to it.  The third lesion is probably an actinic type lesion.       Assessment & Plan:  Actinic keratosis - Plan: fluorouracil (EFUDEX) 5 % cream Although long lesions could easily be a BCE, all of these should respond quite nicely to the Efudex.  Explained proper use of this.  He will call if continued difficulty.  He was comfortable with that.

## 2023-12-21 ENCOUNTER — Encounter: Payer: Managed Care, Other (non HMO) | Admitting: Family Medicine

## 2023-12-22 ENCOUNTER — Encounter: Payer: Self-pay | Admitting: Family Medicine

## 2023-12-22 ENCOUNTER — Ambulatory Visit (INDEPENDENT_AMBULATORY_CARE_PROVIDER_SITE_OTHER): Admitting: Family Medicine

## 2023-12-22 VITALS — BP 126/74 | HR 92 | Ht 69.0 in | Wt 188.6 lb

## 2023-12-22 DIAGNOSIS — Z0000 Encounter for general adult medical examination without abnormal findings: Secondary | ICD-10-CM

## 2023-12-22 DIAGNOSIS — Z87891 Personal history of nicotine dependence: Secondary | ICD-10-CM | POA: Diagnosis not present

## 2023-12-22 DIAGNOSIS — E7800 Pure hypercholesterolemia, unspecified: Secondary | ICD-10-CM | POA: Diagnosis not present

## 2023-12-22 DIAGNOSIS — I10 Essential (primary) hypertension: Secondary | ICD-10-CM | POA: Diagnosis not present

## 2023-12-22 LAB — LIPID PANEL

## 2023-12-22 NOTE — Progress Notes (Signed)
 Name: Jacob Ball   Date of Visit: 12/22/23   Date of last visit with me: Visit date not found   CHIEF COMPLAINT:  Chief Complaint  Patient presents with   Annual Exam    Cpe. Fasting.        HPI:  Discussed the use of AI scribe software for clinical note transcription with the patient, who gave verbal consent to proceed.  History of Present Illness   Jacob Ball is a 48 year old male who presents for a routine physical examination.  He has a history of hypertension and actinic keratosis. He monitors his blood pressure at home occasionally and is currently taking medication for hypertension.  He has been using 5-fluorouracil  (5FU) ointment for actinic keratosis on his face, applying it twice daily for about four weeks. The treatment has resulted in redness, but he believes the condition has improved. He had a couple of spots on his face, one of which was suspected to be cancerous and another a wart.  He has not been vaccinated for COVID-19 and has no interest in receiving the vaccine at this time. He recalls having an HIV screening before getting married, although it is not documented in his chart. 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 He is open to having his immunity to hepatitis B checked through labs. He has not received the pneumococcal vaccine.         OBJECTIVE:       12/22/2023    8:29 AM  Depression screen PHQ 2/9  Decreased Interest 0  Down, Depressed, Hopeless 0  PHQ - 2 Score 0     BP Readings from Last 3 Encounters:  12/22/23 126/74  09/27/23 122/70  09/13/23 120/80    BP 126/74   Pulse 92   Ht 5' 9 (1.753 m)   Wt 188 lb 9.6 oz (85.5 kg)   SpO2 98%   BMI 27.85 kg/m    Physical Exam          Physical Exam Constitutional:      Appearance: Normal appearance.  Skin:    General: Skin is warm.  Neurological:     General: No focal deficit present.      Mental Status: He is alert and oriented to person, place, and time. Mental status is at baseline.     ASSESSMENT/PLAN:   Assessment & Plan Hypercholesteremia  Primary hypertension  Annual physical exam  Former smoker    Assessment and Plan    Adult Wellness Visit Routine wellness visit with discussions on health maintenance, screenings, and vaccinations. Blood pressure controlled. Pneumococcal vaccine not needed. Discussed HIV screening and hepatitis B immunity. Consider testosterone testing based on symptoms and monitor PSA for prostate health. - Order lipid panel, BMP, CBC, PSA, HIV screening, hepatitis B antibody test. - Perform blood draw today. - Monitor blood pressure at home. - Defer pneumococcal vaccine. - Consent obtained for HIV screening. - Order hepatitis B antibody test. - Consider testosterone testing if symptoms suggestive of low testosterone arise. - Monitor PSA levels for prostate health.  Hypertension Chronic Hypertension well-controlled with current medication. Home readings normal. - Continue current hypertension medication. losartan -hydrochlorothiazide (HYZAAR) 50-12.5 MG tablet  - Monitor blood pressure at home and report high readings.  Actinic keratosis Actinic keratosis on face treated with 5FU ointment. Skin redness noted, improvement observed. Discussed monitoring for new or changing lesions and potential dermatology referral if lesions progress. -  Monitor skin for new or changing lesions. - Consider dermatology referral if lesions progress or new concerning lesions appear.       Braelon Sprung A. Vita MD [facility]

## 2023-12-22 NOTE — Patient Instructions (Signed)
 VISIT SUMMARY:  You came in today for a routine physical examination. We discussed your overall health, including your history of hypertension and actinic keratosis. You are currently managing your blood pressure with medication and monitoring it at home. You have been using 5-fluorouracil  (5FU) ointment for actinic keratosis on your face, which has shown some improvement despite causing redness. We also reviewed your vaccination history and discussed potential screenings and tests.  YOUR PLAN:  -ADULT WELLNESS VISIT: This visit was a routine check-up to discuss your overall health, screenings, and vaccinations. Your blood pressure is well-controlled. We discussed that you do not need the pneumococcal vaccine at this time. We also talked about HIV screening and checking your immunity to hepatitis B. We will consider testosterone testing if you show symptoms of low testosterone and will monitor your PSA levels for prostate health. We will perform a blood draw today to check your lipid panel, basic metabolic panel (BMP), complete blood count (CBC), PSA, HIV, and hepatitis B antibody levels.  -HYPERTENSION: Hypertension means high blood pressure. Your blood pressure is well-controlled with your current medication. Please continue taking your medication as prescribed and keep monitoring your blood pressure at home. Report any high readings to us .  -ACTINIC KERATOSIS: Actinic keratosis is a rough, scaly patch on your skin caused by years of sun exposure. You have been treating it with 5-fluorouracil  (5FU) ointment, which has caused some redness but also improvement. Keep an eye on your skin for any new or changing lesions. If any new or concerning lesions appear, we may refer you to a dermatologist.  INSTRUCTIONS:  Please follow up with us  if you notice any new or changing skin lesions or if you have any high blood pressure readings. We will perform a blood draw today to check various health markers,  including your lipid panel, BMP, CBC, PSA, HIV, and hepatitis B antibody levels. Continue monitoring your blood pressure at home and report any high readings. If you experience symptoms that may suggest low testosterone, let us  know so we can consider testing.

## 2023-12-26 ENCOUNTER — Ambulatory Visit: Payer: Self-pay | Admitting: Family Medicine

## 2023-12-26 ENCOUNTER — Telehealth: Payer: Self-pay

## 2023-12-26 NOTE — Telephone Encounter (Signed)
 Copied from CRM [id number]. Topic: Clinical - Lab/Test Results >> Dec 26, 2023  2:58 PM Myrick T wrote: Reason for CRM: patient called to get his lab results and stated he would like to wait on taking the cholesterol medication for about to see if his labs change

## 2023-12-27 LAB — CBC WITH DIFFERENTIAL/PLATELET
Basophils Absolute: 0.1 x10E3/uL (ref 0.0–0.2)
Basos: 1 %
EOS (ABSOLUTE): 0.1 x10E3/uL (ref 0.0–0.4)
Eos: 2 %
Hematocrit: 52.2 % — ABNORMAL HIGH (ref 37.5–51.0)
Hemoglobin: 17.2 g/dL (ref 13.0–17.7)
Immature Grans (Abs): 0 x10E3/uL (ref 0.0–0.1)
Immature Granulocytes: 0 %
Lymphocytes Absolute: 1.3 x10E3/uL (ref 0.7–3.1)
Lymphs: 23 %
MCH: 31.6 pg (ref 26.6–33.0)
MCHC: 33 g/dL (ref 31.5–35.7)
MCV: 96 fL (ref 79–97)
Monocytes Absolute: 0.4 x10E3/uL (ref 0.1–0.9)
Monocytes: 7 %
Neutrophils Absolute: 3.9 x10E3/uL (ref 1.4–7.0)
Neutrophils: 66 %
Platelets: 216 x10E3/uL (ref 150–450)
RBC: 5.44 x10E6/uL (ref 4.14–5.80)
RDW: 12.6 % (ref 11.6–15.4)
WBC: 5.8 x10E3/uL (ref 3.4–10.8)

## 2023-12-27 LAB — COMPREHENSIVE METABOLIC PANEL WITH GFR
ALT: 40 IU/L (ref 0–44)
AST: 23 IU/L (ref 0–40)
Albumin: 4.8 g/dL (ref 4.1–5.1)
Alkaline Phosphatase: 70 IU/L (ref 44–121)
BUN/Creatinine Ratio: 12 (ref 9–20)
BUN: 11 mg/dL (ref 6–24)
Bilirubin Total: 0.8 mg/dL (ref 0.0–1.2)
CO2: 21 mmol/L (ref 20–29)
Calcium: 10 mg/dL (ref 8.7–10.2)
Chloride: 100 mmol/L (ref 96–106)
Creatinine, Ser: 0.9 mg/dL (ref 0.76–1.27)
Globulin, Total: 2.6 g/dL (ref 1.5–4.5)
Glucose: 98 mg/dL (ref 70–99)
Potassium: 4.3 mmol/L (ref 3.5–5.2)
Sodium: 138 mmol/L (ref 134–144)
Total Protein: 7.4 g/dL (ref 6.0–8.5)
eGFR: 105 mL/min/1.73 (ref >59)

## 2023-12-27 LAB — LIPID PANEL
Chol/HDL Ratio: 4.1 ratio (ref 0.0–5.0)
Cholesterol, Total: 250 mg/dL — ABNORMAL HIGH (ref 100–199)
HDL: 61 mg/dL (ref >39)
LDL Chol Calc (NIH): 158 mg/dL — ABNORMAL HIGH (ref 0–99)
Triglycerides: 172 mg/dL — ABNORMAL HIGH (ref 0–149)
VLDL Cholesterol Cal: 31 mg/dL (ref 5–40)

## 2023-12-27 LAB — HIV ANTIBODY (ROUTINE TESTING W REFLEX): HIV Screen 4th Generation wRfx: NONREACTIVE

## 2023-12-27 LAB — HEPATITIS B SURFACE ANTIBODY, QUANTITATIVE: Hepatitis B Surf Ab Quant: 9.9 m[IU]/mL — ABNORMAL LOW

## 2023-12-27 LAB — PSA: Prostate Specific Ag, Serum: 0.9 ng/mL (ref 0.0–4.0)

## 2023-12-27 LAB — HEPATITIS B SURFACE ANTIGEN: Hepatitis B Surface Ag: NEGATIVE

## 2024-01-02 ENCOUNTER — Other Ambulatory Visit: Payer: Self-pay | Admitting: Family Medicine

## 2024-01-02 DIAGNOSIS — I10 Essential (primary) hypertension: Secondary | ICD-10-CM

## 2024-01-02 NOTE — Progress Notes (Signed)
 Subjective:    Jacob Ball is a 48 y.o. (DOB [birth year]) male.     Patient presents with  . Rapid Heart Rate    Pt presents today with c/o high heart rate after cutting the gras this morning     47 YO MALE PT HERE FOR RAPID HEART RATE.      HE MOWED HIS YARD THIS MORNING AND HE FELT FATIGUED.  HIS WATCH ALLERTED HIM HIS HEART RATE WAS ELEVATED.   HE RESTED, THE RATE CAME DOWN, AND HE WENT OUT TO FINISH THE YARD, WHICH WAS A LITTLE AFTER NOON.     HE RATE HAS STAYED OVER 100 ALL AFTERNOON, AND HE COMES IN FOR CONSULTATION.         HE HAS NEVER HAD THIS BEFORE.   DENIES ANY PRESENT CONGESTION, SICKNESS, COUGHS.  NO BUG BITES HE KNOWS OF.  NO RECENT TRAVELS.     HE HAS NO CHEST PAIN, NO DIFFICULTY BREATHING, NO DIAPHORESIS, NO VERTIGO.       HE IS SITTING COMFORTABLY ON THE STRETCHER IN THE ROOM.   HE IS FEELING BETTER AT HEART RATE RIGHT AT 100.  HE TAKES LOSARTAN  FOR BLOOD PRESSURE MEDICATION, WHICH HE TRIES TO TAKE REGULARLY AS DIRECTED, BUT NOT ALWAYS.    Palpitations  This is a new problem. The current episode started today. The problem has been gradually improving. On average, each episode lasts 5 hours. The symptoms are aggravated by unknown. Associated symptoms include malaise/fatigue. Pertinent negatives include no chest pain, coughing, diaphoresis, dizziness, fever, nausea, near-syncope, numbness, shortness of breath, syncope, vomiting or weakness. He has tried nothing for the symptoms. The treatment provided no relief. Risk factors include being male.     Reviewed and updated this visit by provider: None       Review of Systems  Constitutional:  Positive for malaise/fatigue. Negative for diaphoresis and fever.  HENT: Negative.    Eyes: Negative.   Respiratory: Negative.  Negative for cough and shortness of breath.   Cardiovascular:  Positive for palpitations. Negative for chest pain, syncope and near-syncope.  Gastrointestinal: Negative.  Negative for nausea and  vomiting.  Genitourinary: Negative.   Musculoskeletal: Negative.   Neurological: Negative.  Negative for dizziness, weakness and numbness.  Psychiatric/Behavioral: Negative.        Objective:   Vitals:   01/02/24 1711  BP: (!) 140/100  Pulse: 103  Temp: 98.9 F (37.2 C)  TempSrc: Tympanic  Resp: 16  Height: 5' 9 (1.753 m)  Weight: 180 lb (81.6 kg)  SpO2: 92%  BMI (Calculated): 26.6   Physical Exam Vitals and nursing note reviewed.  Constitutional:      Appearance: Normal appearance.  HENT:     Head: Normocephalic and atraumatic.     Right Ear: External ear normal.     Left Ear: External ear normal.     Nose: Nose normal.  Eyes:     Extraocular Movements: Extraocular movements intact.     Conjunctiva/sclera: Conjunctivae normal.  Cardiovascular:     Rate and Rhythm: Regular rhythm. Tachycardia present.     Heart sounds: Normal heart sounds. No murmur heard.    No friction rub. No gallop.  Musculoskeletal:        General: Normal range of motion.     Cervical back: Normal range of motion and neck supple.  Pulmonary:     Effort: Pulmonary effort is normal. No respiratory distress.     Breath sounds: Normal breath sounds. No stridor. No wheezing,  rhonchi or rales.  Abdominal:     Tenderness: There is no right CVA tenderness or left CVA tenderness.  Skin:    General: Skin is warm and dry.  Neurological:     General: No focal deficit present.     Mental Status: He is alert and oriented to person, place, and time.  Psychiatric:        Mood and Affect: Mood normal.        Behavior: Behavior normal.        Assessment / Plan:   Assessment -  PT GIVEN COPY OF HIS ECG.    -  HE IS TOLD TO CALL PCP IN AM, GIVE THE CHANCE TO EVALUATE FURTHER.    IF THEY TELL HIM TO GO TO THE ED, HE WILL GO TO THE [facility] [place]   Plan  -  PT HEART RATE HAS COME DOWN BELOW 100   - ECG DONE AND COPY GIVEN -   NO ACUTE FINDINGS, BORDERLINE IMPRESSION  - WILL F/U W/ PCP IN AM.     Risks, benefits, and alternatives of the medications and treatment plan prescribed today were discussed, and patient expressed understanding. Plan follow-up as discussed or as needed if any worsening symptoms or change in condition.

## 2024-01-03 ENCOUNTER — Ambulatory Visit: Admitting: Family Medicine

## 2024-01-03 ENCOUNTER — Encounter: Payer: Self-pay | Admitting: Family Medicine

## 2024-01-03 VITALS — BP 128/84 | HR 107 | Wt 186.0 lb

## 2024-01-03 DIAGNOSIS — R002 Palpitations: Secondary | ICD-10-CM

## 2024-01-03 DIAGNOSIS — I517 Cardiomegaly: Secondary | ICD-10-CM

## 2024-01-03 NOTE — Progress Notes (Signed)
 Name: Jacob Ball   Date of Visit: 01/03/24   Date of last visit with me: 12/22/2023   CHIEF COMPLAINT:  Chief Complaint  Patient presents with   Acute Visit    Elevated heart rate and blood pressure.        HPI:  Discussed the use of AI scribe software for clinical note transcription with the patient, who gave verbal consent to proceed.  History of Present Illness   Jacob Ball is a 48 year old male who presents with episodes of elevated heart rate and fatigue.  He has been experiencing episodes of elevated heart rate and fatigue over the past two weeks. The first episode occurred on August 7th while scrubbing the bathtub, which he initially attributed to being in an awkward position. He had to sit down and drink water, after which the symptoms subsided. On a subsequent day, while mowing the lawn, he experienced significant fatigue and noted his heart rate increased to 160 bpm, which he felt was high for the level of exertion.  His heart rate remained elevated, reaching 130 bpm while sitting and not dropping below 100 bpm for the rest of the day. His watch alerted him to a heart rate above 120 bpm during these episodes. He denies experiencing shortness of breath when lying flat and does not feel the need to prop himself up to breathe better. Occasional snoring is confirmed by his wife.  He has been monitoring his blood pressure, which was noted to be 140/90 mmHg at one point.         OBJECTIVE:       12/22/2023    8:29 AM  Depression screen PHQ 2/9  Decreased Interest 0  Down, Depressed, Hopeless 0  PHQ - 2 Score 0     BP Readings from Last 3 Encounters:  01/03/24 128/84  12/22/23 126/74  09/27/23 122/70    BP 128/84   Pulse (!) 107   Wt 186 lb (84.4 kg)   SpO2 98%   BMI 27.47 kg/m    Physical Exam          Physical Exam Constitutional:      Appearance: Normal appearance.  Cardiovascular:     Rate and Rhythm: Normal rate and  regular rhythm.  Neurological:     General: No focal deficit present.     Mental Status: He is alert and oriented to person, place, and time. Mental status is at baseline.     ASSESSMENT/PLAN:   Assessment & Plan Left atrial enlargement  Palpitations    Assessment and Plan    Palpitations and exertional tachycardia Episodes of palpitations and elevated heart rate during exertion. EKG shows left atrial enlargement, possibly due to structural heart issues or hypertension. Differential includes valve stenosis or hypertension. Further evaluation needed. - Order echocardiogram at [facility] to assess heart structure, particularly left atrium. - Order lab tests including thyroid and heart markers. - Monitor blood pressure at home, record morning and evening readings for two weeks. - Consider cardiology referral if echocardiogram or lab results indicate need. - Previous note from urgent care on 01/02/2024 reviewed.  Symptoms seem to have resolved while in urgent care.  Hypertension Variable blood pressure readings, with some elevated at home. Office readings normal. Hypertension may contribute to cardiac symptoms and left atrial enlargement. Monitoring needed for medication adjustment. - Monitor blood pressure at home, record morning and evening readings for two weeks. - Consider increasing blood pressure medication if home  readings are consistently high.  Possible sleep apnea Reports of snoring and potential sleep apnea, which may contribute to cardiac issues and heart enlargement. - Order sleep study to evaluate for sleep apnea.         Almendra Loria A. Vita MD [facility]

## 2024-01-04 LAB — TSH RFX ON ABNORMAL TO FREE T4: TSH: 1.5 u[IU]/mL (ref 0.450–4.500)

## 2024-01-04 LAB — BRAIN NATRIURETIC PEPTIDE: BNP: <2.5 pg/mL (ref 0.0–100.0)

## 2024-01-05 ENCOUNTER — Ambulatory Visit: Payer: Self-pay | Admitting: Family Medicine

## 2024-01-05 NOTE — Addendum Note (Signed)
 Addended by: LATTIE CARLO BROCKS on: 01/05/2024 03:23 PM   Modules accepted: Orders

## 2024-01-13 ENCOUNTER — Ambulatory Visit (HOSPITAL_COMMUNITY)
Admission: RE | Admit: 2024-01-13 | Discharge: 2024-01-13 | Disposition: A | Discharge status: Home / Self Care | Source: Ambulatory Visit | Attending: Family Medicine | Admitting: Family Medicine

## 2024-01-13 DIAGNOSIS — R9431 Abnormal electrocardiogram [ECG] [EKG]: Secondary | ICD-10-CM | POA: Diagnosis not present

## 2024-01-13 DIAGNOSIS — R002 Palpitations: Secondary | ICD-10-CM | POA: Diagnosis present

## 2024-01-13 DIAGNOSIS — I517 Cardiomegaly: Secondary | ICD-10-CM | POA: Insufficient documentation

## 2024-01-13 LAB — ECHOCARDIOGRAM COMPLETE
Area-P 1/2: 3.94 cm2
S' Lateral: 2.6 cm

## 2024-02-21 ENCOUNTER — Ambulatory Visit (HOSPITAL_BASED_OUTPATIENT_CLINIC_OR_DEPARTMENT_OTHER): Attending: Family Medicine | Admitting: Internal Medicine

## 2024-02-21 DIAGNOSIS — R002 Palpitations: Secondary | ICD-10-CM | POA: Diagnosis present

## 2024-02-21 DIAGNOSIS — I517 Cardiomegaly: Secondary | ICD-10-CM

## 2024-02-21 DIAGNOSIS — G4733 Obstructive sleep apnea (adult) (pediatric): Secondary | ICD-10-CM | POA: Diagnosis not present

## 2024-03-03 NOTE — Procedures (Signed)
 [facility] [address] [place], [place] [address] Tel: [phone]   Fax: [phone]  Home Sleep Test Interpretation  Patient Name: Jacob Ball, Jacob Ball Date: 02/21/2024  Date of Birth: Apr 07, 1976 Study Type: HST  Age: 48 year MRN #: [id number]  Sex: Male Interpreting Physician: NEYSA RAMA, [id number]  Height: 5' 9 Referring Physician: REYNE JHA [phone])  Weight: 186.0 lbs Recording Tech: Holly Neeriemer RPSGT RST  BMI: 27.5 Scoring Tech: Will Poet RRT RPSGT RST  ESS: 3 Neck Size: -  %%startinterp%% %%startinterp%% Indications for Polysomnography The patient is a 48 year old Male who is 5' 9 and weighs 186.0 lbs. His BMI equals 27.5.  A home sleep apnea test was performed to evaluate for -.OSA  Medication  No Data.   Polysomnogram Data A home sleep test recorded the standard physiologic parameters including EKG, nasal and oral airflow.  Respiratory parameters of chest and abdominal movements were recorded with Respiratory Inductance Plethysmography belts.  Oxygen saturation was recorded by pulse oximetry.   Study Architecture The total recording time of the polysomnogram was 400.8 minutes. The total monitoring time was 401.0 minutes.  Time spent in Supine position was 271.0 minutes.   Respiratory Events The study revealed a presence of 116 obstructive, 8 central, and 2 mixed apneas resulting in an Apnea index of 18.9 events per hour.  There were 85 hypopneas (>=3% desaturation and/or arousal) resulting in an Apnea\Hypopnea Index (AHI >=3% desaturation and/or arousal) of 31.6 events per hour.  There were 72 hypopneas (>=4% desaturation) resulting in an Apnea\Hypopnea Index (AHI >=4% desaturation) of 29.6 events per hour.  There were - Respiratory Effort Related Arousals resulting in a RERA index of - events per hour. The Respiratory Disturbance Index is 31.6 events per hour.  The snore index was - events per hour.  Mean  oxygen saturation was 91.0%.  The lowest oxygen saturation during monitoring time was 79.0%.  Time spent <=88% oxygen saturation was 77.5 minutes (19.7%).  Cardiac Summary The average pulse rate was 79.7 bpm.  The minimum pulse rate was 60.0 bpm while the maximum pulse rate was 109.0 bpm.  Cardiac rhythm was normal/abnormal.  Comments: Severe obstructive sleep apnea, AHI(3%) 31.6/hr. Snoring with oxygen desaturation to a nadir of 79%, mean 91%. Patient had difficulty maintaining sleep after 01:00 AM- see hypnogram below.  Diagnosis: Obstructive sleep apnea. Possible insomnia.  Recommendations: Suggest autopap or CPAP titration sleep study.   This study was personally reviewed and electronically signed by: RAMA NEYSA, MD Accredited Board Certified in Sleep Medicine Date/Time: 03/03/24   12:34    %%endinterp%% %%endinterp%%    Study Overview  Recording Time: 548.0 min. Monitoring Time: 401.0 min.  Analysis Start:  09:24:58 PM Supine Time: 271.0 min.  Analysis Stop:  04:05:47 AM     Study Summary   Count Index Longest Event Duration  Apneas & Hypopneas: 211 31.6  Apneas: 52.1 sec.     Hypopneas: 126.1 sec.  RERAs: - - - sec.  Desaturations: 229 34.3 132.5 sec.  Snores: - - - sec.    Minimum Oxygen Saturation: 79.0%    Respiratory Summary   Total Duration Supine Non-Supine   Count Index Average Longest Count Index Count Index  Obstructive Apnea 116 17.4 25.2 52.1 103 22.8 13 6.0   Mixed Apnea 2 0.3 43.0 48.1 2 0.4 - -   Central Apnea 8 1.2 19.1 33.2 3 0.7 5 2.3   Total Apneas 126 18.9 25.1 52.1 108 23.9  18 8.3            Hypopneas 3% 85 12.7 N.A. N.A. 69 15.3 16 7.4   Apneas & Hyp. 3% 211 31.6 N.A. N.A. 177 39.2 34 15.7            Hypopneas 4% 72 10.8 N.A. N.A. 63 13.9 9 4.2  Apneas & Hyp. 4% 198 29.6 N.A. N.A. 171 37.9 27 12.5             RERAs - - - - - - - -  RDI 212 31.7 N.A. N.A. 178 39.4 34 15.7   Oxygen Saturation Summary   Total Supine Non-Supine   Average SpO2 91.0% 90.1% 93.0%  Minimum SpO2 79.0% 79.0% 82.0%   Maximum SpO2 99.0% 99.0% 99.0%   Oxygen Saturation Distribution  Range (%) Time in range (min) Time in range (%)  90.0 - 100.0 245.3 62.3%  80.0 - 90.0 148.0 37.6%  70.0 - 80.0 0.3 0.1%  60.0 - 70.0 - -  50.0 - 60.0 - -  0.0 - 50.0 - -  Time Spent <=88% SpO2  Range (%) Time in range (min) Time in range (%)  0.0 - 88.0 77.5 19.7%  Cardiac Summary   Total Supine Non-Supine  Average Pulse Rate (BPM) 79.7 81.7 75.2  Minimum Pulse Rate (BPM) 60.0 64.0 60.0  Maximum Pulse Rate (BPM) 109.0 105.0 109.0                      Technologist Comments  -                        Reggy Neysa Bateman, Biomedical engineer of Sleep Medicine  ELECTRONICALLY SIGNED ON:  03/03/2024, 12:30 PM [facility] PH: [phone]   FX: [phone] ACCREDITED BY THE [employer]ICAN ACADEMY OF SLEEP MEDICINE

## 2024-03-03 NOTE — Procedures (Signed)
%%  startinterp%% Indications for Polysomnography The patient is a 48 year old Male who is 5' 9 and weighs 186.0 lbs. His BMI equals 27.5.  A home sleep apnea test was performed to evaluate for -.  MedicationNo Data. Polysomnogram Data A home sleep test recorded the standard physiologic parameters including EKG, nasal and oral airflow.  Respiratory parameters of chest and abdominal movements were recorded with Respiratory Inductance Plethysmography belts.  Oxygen saturation was  recorded by pulse oximetry.  Study Architecture The total recording time of the polysomnogram was 400.8 minutes. The total monitoring time was 401.0 minutes.  Time spent in Supine position was 271.0 minutes.  Respiratory Events The study revealed a presence of 116 obstructive, 8 central, and 2 mixed apneas resulting in an Apnea index of 18.9 events per hour.  There were 85 hypopneas (GreaterEqual to3% desaturation and/or arousal) resulting in an Apnea\Hypopnea Index (AHI  GreaterEqual to3% desaturation and/or arousal) of 31.6 events per hour.  There were 72 hypopneas (GreaterEqual to4% desaturation) resulting in an Apnea\Hypopnea Index (AHI GreaterEqual to4% desaturation) of 29.6 events per hour.  There were - Respiratory  Effort Related Arousals resulting in a RERA index of - events per hour. The Respiratory Disturbance Index is 31.6 events per hour.  The snore index was - events per hour.  Mean oxygen saturation was 91.0%.  The lowest oxygen saturation during monitoring time was 79.0%.  Time spent LessEqual to88% oxygen saturation was  minutes ().  Cardiac Summary The average pulse rate was 79.7 bpm.  The minimum pulse rate was 60.0 bpm while the maximum pulse rate was 109.0 bpm.  Cardiac rhythm was normal/abnormal.  Comments:  Diagnosis:  Recommendations:   This study was personally reviewed and electronically signed by: Reggy Salt, MD Accredited Board Certified in Sleep Medicine Date/Time:

## 2024-03-12 ENCOUNTER — Encounter: Payer: Self-pay | Admitting: Family Medicine

## 2024-03-12 DIAGNOSIS — G4733 Obstructive sleep apnea (adult) (pediatric): Secondary | ICD-10-CM

## 2024-03-13 NOTE — Telephone Encounter (Signed)
 Patient would like to go ahead and get the CPAP machine.

## 2024-03-23 ENCOUNTER — Other Ambulatory Visit: Payer: Self-pay | Admitting: Family Medicine

## 2024-03-23 DIAGNOSIS — I10 Essential (primary) hypertension: Secondary | ICD-10-CM

## 2024-03-23 MED ORDER — LOSARTAN POTASSIUM-HCTZ 50-12.5 MG PO TABS
1.0000 | ORAL_TABLET | Freq: Every day | ORAL | 3 refills | Status: AC
Start: 2024-03-23 — End: ?

## 2024-03-23 NOTE — Telephone Encounter (Unsigned)
 Copied from CRM [id number]. Topic: Clinical - Medication Refill >> Mar 23, 2024 11:39 AM Kevelyn M wrote: Medication: losartan -hydrochlorothiazide (HYZAAR) 50-12.5 MG tablet  Has the patient contacted their pharmacy? Yes (Agent: If no, request that the patient contact the pharmacy for the refill. If patient does not wish to contact the pharmacy document the reason why and proceed with request.) (Agent: If yes, when and what did the pharmacy advise?)  This is the patient's preferred pharmacy:  [facility] [address] [place], [place] [address] [phone]  Is this the correct pharmacy for this prescription? Yes If no, delete pharmacy and type the correct one.   Has the prescription been filled recently? No  Is the patient out of the medication? No  Has the patient been seen for an appointment in the last year OR does the patient have an upcoming appointment? Yes  Can we respond through MyChart? Yes  Agent: Please be advised that Rx refills may take up to 3 business days. We ask that you follow-up with your pharmacy.

## 2024-06-20 ENCOUNTER — Ambulatory Visit: Admitting: Family Medicine

## 2024-06-20 ENCOUNTER — Encounter: Payer: Self-pay | Admitting: Family Medicine

## 2024-06-20 VITALS — BP 138/80 | HR 100 | Ht 69.0 in | Wt 195.0 lb

## 2024-06-20 DIAGNOSIS — G4733 Obstructive sleep apnea (adult) (pediatric): Secondary | ICD-10-CM | POA: Diagnosis not present

## 2024-06-20 NOTE — Progress Notes (Signed)
" ° °  Subjective:    Patient ID: Jacob Ball, male    DOB: [birth year], 49 y.o.   MRN: [id number]  Discussed the use of AI scribe software for clinical note transcription with the patient, who gave verbal consent to proceed.  History of Present Illness   Jacob Ball is a 49 year old male who presents for follow-up on CPAP therapy.  He was diagnosed with sleep apnea on October 2 5th of last year and has been using a CPAP machine since then.he adjusted and now finds it quiet and tolerable.  He has been compliant with CPAP therapy, using it consistently for ninety days in a row, as confirmed by a reward on his app. He averages eight hours and twenty-four minutes of use per night.  Since starting CPAP therapy, he sleeps more and wakes up feeling more refreshed. There is a notable reduction in daytime drowsiness, and he no longer feels drowsy in the afternoon, which was a problem before starting the therapy.  His apnea-hypopnea index (AHI) has decreased from above fifteen to 3.8. He is prioritizing sleep and feels that he is getting more rest.           Review of Systems     Objective:    Physical Exam And in no distress.           Assessment & Plan:     Obstructive sleep apnea Effective CPAP therapy with improved symptoms and reduced AHI. - Continue CPAP therapy. - Schedule annual follow-up to monitor compliance and effectiveness.        "

## 2024-07-31 ENCOUNTER — Ambulatory Visit: Admitting: Family Medicine

## 2024-12-21 ENCOUNTER — Encounter: Payer: Self-pay | Admitting: Family Medicine
